# Patient Record
Sex: Male | Born: 1990 | Race: White | Hispanic: No | Marital: Single | Smoking: Never smoker
Health system: Southern US, Community
[De-identification: ages and names within clinical notes are randomized; demographics above are authoritative.]

## PROBLEM LIST (undated history)

## (undated) DIAGNOSIS — F32A Depression, unspecified: Secondary | ICD-10-CM

## (undated) DIAGNOSIS — F329 Major depressive disorder, single episode, unspecified: Secondary | ICD-10-CM

## (undated) HISTORY — PX: ABDOMINAL SURGERY: SHX537

## (undated) HISTORY — PX: NISSEN FUNDOPLICATION: SHX2091

---

## 2010-10-04 ENCOUNTER — Emergency Department (HOSPITAL_COMMUNITY): Admission: EM | Admit: 2010-10-04 | Discharge: 2010-10-04 | Payer: Self-pay | Admitting: Emergency Medicine

## 2012-12-24 ENCOUNTER — Ambulatory Visit (INDEPENDENT_AMBULATORY_CARE_PROVIDER_SITE_OTHER): Payer: BC Managed Care – PPO | Admitting: Family Medicine

## 2012-12-24 VITALS — BP 136/80 | HR 92 | Temp 97.5°F | Resp 16 | Ht 69.0 in | Wt 153.0 lb

## 2012-12-24 DIAGNOSIS — M79643 Pain in unspecified hand: Secondary | ICD-10-CM

## 2012-12-24 DIAGNOSIS — S61409A Unspecified open wound of unspecified hand, initial encounter: Secondary | ICD-10-CM

## 2012-12-24 DIAGNOSIS — M79609 Pain in unspecified limb: Secondary | ICD-10-CM

## 2012-12-24 NOTE — Progress Notes (Signed)
Verbal consent obtained from the patient.  Local anesthesia with 2cc Lidocaine 1% without epinephrine.  Wound scrubbed with soap and water and rinsed.  Wound closed with #3 5-0 Prolene (#1 HM, #2 SI) sutures.  Wound cleansed and dressed.

## 2012-12-24 NOTE — Progress Notes (Signed)
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 He had the day off and was trying to make some furniture. He says is soft some right when it was opened. He is confident that his last tetanus shot was within 5 years, that 4 years ago he got excessive shots before going to [place].  Objective: Left hand has a 1.5 cm wound in the palm of the hand. It is closed and not bleeding at this time. Fingers have good flexion extension and sensory and strength.  Assessment: Wound left hand  Plan: He will need sutures. It needs to be numbed up and explored to make sure no tendons were lacerated.

## 2012-12-24 NOTE — Patient Instructions (Addendum)

## 2012-12-31 ENCOUNTER — Ambulatory Visit (INDEPENDENT_AMBULATORY_CARE_PROVIDER_SITE_OTHER): Payer: BC Managed Care – PPO | Admitting: Physician Assistant

## 2012-12-31 VITALS — BP 132/84 | HR 68 | Temp 98.7°F | Resp 16 | Ht 69.5 in | Wt 153.0 lb

## 2012-12-31 DIAGNOSIS — Z4802 Encounter for removal of sutures: Secondary | ICD-10-CM

## 2012-12-31 NOTE — Progress Notes (Signed)
   Patient ID: Javione Gunawan MRN: [id number], DOB: [birth year] 22 y.o. Date of Encounter: 12/31/2012, 5:26 PM  Primary Physician: No primary provider on file.  Chief Complaint: Suture removal    See note from 12/24/12  HPI: 22 y.o. y/o male with injury to left hand Here for suture removal s/p placement on 12/24/12 Doing well No issues/complaints Afebrile/ No chills No erythema No pain Able to move without difficulty Normal sensation  No past medical history on file.   Home Meds: Prior to Admission medications   Medication Sig Start Date End Date Taking? Authorizing Provider  ibuprofen (ADVIL,MOTRIN) 200 MG tablet Take 200 mg by mouth every 6 (six) hours as needed.   Yes Historical Provider, MD    Allergies: No Known Allergies  Physical Exam: Blood pressure 132/84, pulse 68, temperature 98.7 F (37.1 C), temperature source Oral, resp. rate 16, height 5' 9.5" (1.765 m), weight 153 lb (69.4 kg), SpO2 98.00%., Body mass index is 22.27 kg/(m^2). General: Well developed, well nourished, in no acute distress. Head: Normocephalic, atraumatic, sclera non-icteric, no xanthomas, nares are without discharge.  Neck: Supple. Lungs: Breathing is unlabored. Heart: Normal rate. Msk:  Strength and tone appear normal for age. Wound: Wound well healed without erythema, swelling, or tenderness to palpation. FROM and 5/5 strength with normal sensation throughout including 2 point discrimination. Flexion and extension intact throughout all digits.  Skin: See above, otherwise dry without rash or erythema. Extremities: No clubbing or cyanosis. No edema. Neuro: Alert and oriented X 3. Moves all extremities spontaneously.  Psych:  Responds to questions appropriately with a normal affect.   PROCEDURE: Verbal consent obtained. 1 HM and 2 SI sutures removed without difficulty. Wound washed.  Assessment and Plan: 22 y.o. y/o male here for suture removal for wound described above. -Sutures removed per  above -Wound resolved -RTC prn -Patient with presyncopal episode after suture removal. He was placed in trendelenburg position and a cool rag was placed on his forehead. After a short while patient again felt like himself and he was able to sit up without further difficulty.    SignedEula Listen, PA-C 12/31/2012 5:26 PM

## 2013-01-01 ENCOUNTER — Ambulatory Visit (INDEPENDENT_AMBULATORY_CARE_PROVIDER_SITE_OTHER): Payer: BC Managed Care – PPO | Admitting: Physician Assistant

## 2013-01-01 VITALS — BP 135/78 | HR 71 | Temp 97.8°F | Resp 18 | Ht 69.0 in | Wt 154.0 lb

## 2013-01-01 DIAGNOSIS — S61409A Unspecified open wound of unspecified hand, initial encounter: Secondary | ICD-10-CM

## 2013-01-01 DIAGNOSIS — M79609 Pain in unspecified limb: Secondary | ICD-10-CM

## 2013-01-01 NOTE — Progress Notes (Signed)
  Subjective:    Patient ID: Chad Andrews, male    DOB: 12-Nov-1991, 22 y.o.   MRN: [id number]  HPI 22 year old male presents s/p suture removal yesterday. States he was closing the lid of a plastic bin today and placed a bit of pressure on the palm of his left hand and the wound re-opened.  Admits to pain and bleeding.  No numbness or tingling.  He is otherwise doing well with no other concerns today.      Review of Systems  Constitutional: Negative for fever and chills.  Musculoskeletal: Negative for joint swelling and arthralgias.  Skin: Positive for wound. Negative for color change.  All other systems reviewed and are negative.       Objective:   Physical Exam  Constitutional: He is oriented to person, place, and time. He appears well-developed and well-nourished.  HENT:  Head: Normocephalic and atraumatic.  Right Ear: External ear normal.  Left Ear: External ear normal.  Eyes: Conjunctivae normal are normal.  Neck: Normal range of motion.  Cardiovascular: Normal rate.   Pulmonary/Chest: Effort normal.  Neurological: He is alert and oriented to person, place, and time.  Skin:       Small 0.5 cm superficial opening to previously well healing laceration of left palm.  No surrounding erythema, drainage, or warmth.   Psychiatric: He has a normal mood and affect. His behavior is normal. Judgment and thought content normal.     Dermabond applied to wound. Patient tolerated well.      Assessment & Plan:   1. Wound, open, hand with or without fingers    Recommend at least 24-48 hours of limited use of left hand.   Wound doing well. Follow up as needed.

## 2016-04-05 ENCOUNTER — Emergency Department (HOSPITAL_COMMUNITY): Payer: BC Managed Care – PPO

## 2016-04-05 ENCOUNTER — Encounter (HOSPITAL_COMMUNITY): Payer: Self-pay | Admitting: Emergency Medicine

## 2016-04-05 ENCOUNTER — Emergency Department (HOSPITAL_COMMUNITY)
Admission: EM | Admit: 2016-04-05 | Discharge: 2016-04-05 | Disposition: A | Discharge status: Home / Self Care | Payer: BC Managed Care – PPO | Attending: Emergency Medicine | Admitting: Emergency Medicine

## 2016-04-05 DIAGNOSIS — R109 Unspecified abdominal pain: Secondary | ICD-10-CM

## 2016-04-05 DIAGNOSIS — Z79899 Other long term (current) drug therapy: Secondary | ICD-10-CM | POA: Insufficient documentation

## 2016-04-05 DIAGNOSIS — F329 Major depressive disorder, single episode, unspecified: Secondary | ICD-10-CM | POA: Insufficient documentation

## 2016-04-05 DIAGNOSIS — R112 Nausea with vomiting, unspecified: Secondary | ICD-10-CM | POA: Insufficient documentation

## 2016-04-05 DIAGNOSIS — R1010 Upper abdominal pain, unspecified: Secondary | ICD-10-CM | POA: Diagnosis not present

## 2016-04-05 DIAGNOSIS — R1013 Epigastric pain: Secondary | ICD-10-CM | POA: Diagnosis present

## 2016-04-05 HISTORY — DX: Depression, unspecified: F32.A

## 2016-04-05 HISTORY — DX: Major depressive disorder, single episode, unspecified: F32.9

## 2016-04-05 LAB — URINALYSIS, ROUTINE W REFLEX MICROSCOPIC
BILIRUBIN URINE: NEGATIVE
GLUCOSE, UA: NEGATIVE mg/dL
HGB URINE DIPSTICK: NEGATIVE
KETONES UR: NEGATIVE mg/dL
Leukocytes, UA: NEGATIVE
NITRITE: NEGATIVE
PH: 7 (ref 5.0–8.0)
Protein, ur: NEGATIVE mg/dL
SPECIFIC GRAVITY, URINE: 1.014 (ref 1.005–1.030)

## 2016-04-05 LAB — COMPREHENSIVE METABOLIC PANEL
ALBUMIN: 4.7 g/dL (ref 3.5–5.0)
ALT: 18 U/L (ref 17–63)
ANION GAP: 10 (ref 5–15)
AST: 23 U/L (ref 15–41)
Alkaline Phosphatase: 72 U/L (ref 38–126)
BILIRUBIN TOTAL: 1 mg/dL (ref 0.3–1.2)
BUN: 9 mg/dL (ref 6–20)
CHLORIDE: 103 mmol/L (ref 101–111)
CO2: 27 mmol/L (ref 22–32)
Calcium: 9.4 mg/dL (ref 8.9–10.3)
Creatinine, Ser: 0.87 mg/dL (ref 0.61–1.24)
GFR calc Af Amer: >60 mL/min (ref >60)
GFR calc non Af Amer: >60 mL/min (ref >60)
GLUCOSE: 97 mg/dL (ref 65–99)
POTASSIUM: 3.7 mmol/L (ref 3.5–5.1)
SODIUM: 140 mmol/L (ref 135–145)
Total Protein: 8.2 g/dL — ABNORMAL HIGH (ref 6.5–8.1)

## 2016-04-05 LAB — CBC
HEMATOCRIT: 48.2 % (ref 39.0–52.0)
HEMOGLOBIN: 16.5 g/dL (ref 13.0–17.0)
MCH: 30.3 pg (ref 26.0–34.0)
MCHC: 34.2 g/dL (ref 30.0–36.0)
MCV: 88.6 fL (ref 78.0–100.0)
Platelets: 256 10*3/uL (ref 150–400)
RBC: 5.44 MIL/uL (ref 4.22–5.81)
RDW: 12.5 % (ref 11.5–15.5)
WBC: 8 10*3/uL (ref 4.0–10.5)

## 2016-04-05 LAB — LIPASE, BLOOD: LIPASE: 83 U/L — AB (ref 11–51)

## 2016-04-05 MED ORDER — IOPAMIDOL (ISOVUE-300) INJECTION 61%
100.0000 mL | Freq: Once | INTRAVENOUS | Status: AC | PRN
  Administered 2016-04-05: 100 mL via INTRAVENOUS

## 2016-04-05 MED ORDER — DIATRIZOATE MEGLUMINE & SODIUM 66-10 % PO SOLN
30.0000 mL | Freq: Once | ORAL | Status: AC
  Administered 2016-04-05: 30 mL via ORAL

## 2016-04-05 MED ORDER — ONDANSETRON HCL 4 MG/2ML IJ SOLN
4.0000 mg | Freq: Once | INTRAMUSCULAR | Status: AC
  Administered 2016-04-05: 4 mg via INTRAVENOUS
  Filled 2016-04-05: qty 2

## 2016-04-05 MED ORDER — SODIUM CHLORIDE 0.9 % IV BOLUS (SEPSIS)
1000.0000 mL | Freq: Once | INTRAVENOUS | Status: AC
  Administered 2016-04-05: 1000 mL via INTRAVENOUS

## 2016-04-05 MED ORDER — ONDANSETRON 4 MG PO TBDP: 4.0000 mg | ORAL_TABLET | Freq: Three times a day (TID) | ORAL | Status: AC | PRN

## 2016-04-05 NOTE — ED Notes (Signed)
Pt has a hx of Nissen Fundoplication surgery as a child. States the first time he's ever vomited in his life was yesterday after a work out. Since vomiting has had sharp worsening upper abdominal pain. Says since he's occasionally felt light headed, seeing spots, and continued to feel nauseous. Does not appear to be in obvious distress in triage. Abdomin firm on palpation, tender towards the epigastrium.

## 2016-04-05 NOTE — ED Notes (Signed)
RN starting IV will collect labs 

## 2016-04-05 NOTE — Discharge Instructions (Signed)
Abdominal Pain, Adult °Many things can cause abdominal pain. Usually, abdominal pain is not caused by a disease and will improve without treatment. It can often be observed and treated at home. Your health care provider will do a physical exam and possibly order blood tests and X-rays to help determine the seriousness of your pain. However, in many cases, more time must pass before a clear cause of the pain can be found. Before that point, your health care provider may not know if you need more testing or further treatment. °HOME CARE INSTRUCTIONS °Monitor your abdominal pain for any changes. The following actions may help to alleviate any discomfort you are experiencing: °· Only take over-the-counter or prescription medicines as directed by your health care provider. °· Do not take laxatives unless directed to do so by your health care provider. °· Try a clear liquid diet (broth, tea, or water) as directed by your health care provider. Slowly move to a bland diet as tolerated. °SEEK MEDICAL CARE IF: °· You have unexplained abdominal pain. °· You have abdominal pain associated with nausea or diarrhea. °· You have pain when you urinate or have a bowel movement. °· You experience abdominal pain that wakes you in the night. °· You have abdominal pain that is worsened or improved by eating food. °· You have abdominal pain that is worsened with eating fatty foods. °· You have a fever. °SEEK IMMEDIATE MEDICAL CARE IF: °· Your pain does not go away within 2 hours. °· You keep throwing up (vomiting). °· Your pain is felt only in portions of the abdomen, such as the right side or the left lower portion of the abdomen. °· You pass bloody or black tarry stools. °MAKE SURE YOU: °· Understand these instructions. °· Will watch your condition. °· Will get help right away if you are not doing well or get worse. °  °This information is not intended to replace advice given to you by your health care provider. Make sure you discuss  any questions you have with your health care provider. °  °Document Released: 08/31/2005 Document Revised: 08/12/2015 Document Reviewed: 07/31/2013 °Elsevier Interactive Patient Education ©2016 Elsevier Inc. ° °Nausea and Vomiting °Nausea is a sick feeling that often comes before throwing up (vomiting). Vomiting is a reflex where stomach contents come out of your mouth. Vomiting can cause severe loss of body fluids (dehydration). Children and elderly adults can become dehydrated quickly, especially if they also have diarrhea. Nausea and vomiting are symptoms of a condition or disease. It is important to find the cause of your symptoms. °CAUSES  °· Direct irritation of the stomach lining. This irritation can result from increased acid production (gastroesophageal reflux disease), infection, food poisoning, taking certain medicines (such as nonsteroidal anti-inflammatory drugs), alcohol use, or tobacco use. °· Signals from the brain. These signals could be caused by a headache, heat exposure, an inner ear disturbance, increased pressure in the brain from injury, infection, a tumor, or a concussion, pain, emotional stimulus, or metabolic problems. °· An obstruction in the gastrointestinal tract (bowel obstruction). °· Illnesses such as diabetes, hepatitis, gallbladder problems, appendicitis, kidney problems, cancer, sepsis, atypical symptoms of a heart attack, or eating disorders. °· Medical treatments such as chemotherapy and radiation. °· Receiving medicine that makes you sleep (general anesthetic) during surgery. °DIAGNOSIS °Your caregiver may ask for tests to be done if the problems do not improve after a few days. Tests may also be done if symptoms are severe or if the reason for the   nausea and vomiting is not clear. Tests may include: °· Urine tests. °· Blood tests. °· Stool tests. °· Cultures (to look for evidence of infection). °· X-rays or other imaging studies. °Test results can help your caregiver make  decisions about treatment or the need for additional tests. °TREATMENT °You need to stay well hydrated. Drink frequently but in small amounts. You may wish to drink water, sports drinks, clear broth, or eat frozen ice pops or gelatin dessert to help stay hydrated. When you eat, eating slowly may help prevent nausea. There are also some antinausea medicines that may help prevent nausea. °HOME CARE INSTRUCTIONS  °· Take all medicine as directed by your caregiver. °· If you do not have an appetite, do not force yourself to eat. However, you must continue to drink fluids. °· If you have an appetite, eat a normal diet unless your caregiver tells you differently. °¨ Eat a variety of complex carbohydrates (rice, wheat, potatoes, bread), lean meats, yogurt, fruits, and vegetables. °¨ Avoid high-fat foods because they are more difficult to digest. °· Drink enough water and fluids to keep your urine clear or pale yellow. °· If you are dehydrated, ask your caregiver for specific rehydration instructions. Signs of dehydration may include: °¨ Severe thirst. °¨ Dry lips and mouth. °¨ Dizziness. °¨ Dark urine. °¨ Decreasing urine frequency and amount. °¨ Confusion. °¨ Rapid breathing or pulse. °SEEK IMMEDIATE MEDICAL CARE IF:  °· You have blood or brown flecks (like coffee grounds) in your vomit. °· You have black or bloody stools. °· You have a severe headache or stiff neck. °· You are confused. °· You have severe abdominal pain. °· You have chest pain or trouble breathing. °· You do not urinate at least once every 8 hours. °· You develop cold or clammy skin. °· You continue to vomit for longer than 24 to 48 hours. °· You have a fever. °MAKE SURE YOU:  °· Understand these instructions. °· Will watch your condition. °· Will get help right away if you are not doing well or get worse. °  °This information is not intended to replace advice given to you by your health care provider. Make sure you discuss any questions you have with  your health care provider. °  °Document Released: 11/21/2005 Document Revised: 02/13/2012 Document Reviewed: 04/20/2011 °Elsevier Interactive Patient Education ©2016 Elsevier Inc. ° °

## 2016-04-05 NOTE — ED Notes (Signed)
PT DISCHARGED. INSTRUCTIONS AND PRESCRIPTION GIVEN. AAOX3. PT IN NO APPARENT DISTRESS OR PAIN. THE OPPORTUNITY TO ASK QUESTIONS WAS PROVIDED. 

## 2016-04-05 NOTE — ED Provider Notes (Signed)
CSN: [id number]     Arrival date & time 04/05/16  1011 History   First MD Initiated Contact with Patient 04/05/16 1116     No chief complaint on file.  HPI  Mr. Chad Andrews is a 25 year old male with past medical history of depression and severe reflux as an infant requiring Nissen fundoplication presenting with nausea, vomiting and abdominal pain. Patient states that he was working out yesterday and immediately vomited afterwards. He states this is the first time he has ever vomited in his entire life. He reports onset of sharp upper abdominal pain since the emesis. He states the pain is mostly in the epigastric region but can also be felt in the right and left upper quadrants. The pain is intermittent without specific exacerbating factors. He endorses nausea but no other episodes of vomiting. He also endorses occasional lightheadedness and "seeing spots". He states that this typically happens when he is up and moving around. Denies fevers, chills, urinary symptoms, chest pain, shortness of breath, constipation or diarrhea. He denies following with a gastroenterologist.  Past Medical History  Diagnosis Date  . Depression    Past Surgical History  Procedure Laterality Date  . Nissen fundoplication    . Abdominal surgery     History reviewed. No pertinent family history. Social History  Substance Use Topics  . Smoking status: Never Smoker   . Smokeless tobacco: None  . Alcohol Use: None    Review of Systems  All other systems reviewed and are negative.     Allergies  Review of patient's allergies indicates no known allergies.  Home Medications   Prior to Admission medications   Medication Sig Start Date End Date Taking? Authorizing Provider  escitalopram (LEXAPRO) 10 MG tablet Take 10 mg by mouth daily. 01/20/16  Yes Historical Provider, MD  ibuprofen (ADVIL,MOTRIN) 200 MG tablet Take 600 mg by mouth every 6 (six) hours as needed for moderate pain.    Yes Historical Provider, MD   naproxen sodium (ANAPROX) 220 MG tablet Take 220 mg by mouth 2 (two) times daily as needed (pain).   Yes Historical Provider, MD  ondansetron (ZOFRAN ODT) 4 MG disintegrating tablet Take 1 tablet (4 mg total) by mouth every 8 (eight) hours as needed for nausea or vomiting. 04/05/16   Mandi Mattioli, PA-C   BP 125/75 mmHg  Pulse 74  Temp(Src) 98 F (36.7 C) (Oral)  Resp 18  Ht  (1.753 m)  Wt 77.111 kg  BMI 25.09 kg/m2  SpO2 100% Physical Exam  Constitutional: He appears well-developed and well-nourished. No distress.  Nontoxic-appearing  HENT:  Head: Normocephalic and atraumatic.  Eyes: Conjunctivae are normal. Right eye exhibits no discharge. Left eye exhibits no discharge. No scleral icterus.  Neck: Normal range of motion.  Cardiovascular: Normal rate, regular rhythm and normal heart sounds.   Pulmonary/Chest: Effort normal and breath sounds normal. No respiratory distress.  Abdominal: Soft. Bowel sounds are normal. He exhibits no distension. There is tenderness (mild upper abdominal tenderness). There is no rebound and no guarding.  Musculoskeletal: Normal range of motion.  Neurological: He is alert. Coordination normal.  Skin: Skin is warm and dry.  Psychiatric: He has a normal mood and affect. His behavior is normal.  Nursing note and vitals reviewed.   ED Course  Procedures (including critical care time) Labs Review Labs Reviewed  LIPASE, BLOOD - Abnormal; Notable for the following:    Lipase 83 (*)    All other components within normal limits  COMPREHENSIVE  METABOLIC PANEL - Abnormal; Notable for the following:    Total Protein 8.2 (*)    All other components within normal limits  URINALYSIS, ROUTINE W REFLEX MICROSCOPIC (NOT AT [facility]) - Abnormal; Notable for the following:    APPearance CLOUDY (*)    All other components within normal limits  CBC    Imaging Review Ct Abdomen Pelvis W Contrast  04/05/2016  CLINICAL DATA:  Upper abdomen pain since this morning.  Patient has a history of Nissen fundoplication. EXAM: CT ABDOMEN AND PELVIS WITH CONTRAST TECHNIQUE: Multidetector CT imaging of the abdomen and pelvis was performed using the standard protocol following bolus administration of intravenous contrast. CONTRAST:  100mL ISOVUE-300 IOPAMIDOL (ISOVUE-300) INJECTION 61% COMPARISON:  None. FINDINGS: Lower chest: There is a 2 mm peripheral nodule in the lateral inferior left upper lobe. There is no focal pneumonia or pleural effusion. The heart size is normal. Hepatobiliary: The gallbladder and liver are normal. No masses or other significant abnormality. Pancreas: No mass, inflammatory changes, or other significant abnormality. Spleen: Within normal limits in size and appearance. Adrenals/Urinary Tract: The kidneys and adrenal glands are normal. The bladder is normal. No masses identified. No evidence of hydronephrosis. Stomach/Bowel: No evidence of obstruction, inflammatory process, or abnormal fluid collections. There is no diverticulitis. The appendix is normal. Vascular/Lymphatic: No pathologically enlarged lymph nodes. No evidence of abdominal aortic aneurysm. Reproductive: No mass or other significant abnormality. Other: None. Musculoskeletal:  No suspicious bone lesions identified. IMPRESSION: No acute abnormality identified in the abdomen and pelvis. 2 mm peripheral nodule in the lateral inferior left upper lobe. No follow-up needed if patient is low-risk. Non-contrast chest CT can be considered in 12 months if patient is high-risk. This recommendation follows the consensus statement: Guidelines for Management of Incidental Pulmonary Nodules Detected on CT Images:From the Fleischner Society 2017; published online before print (10.1148/[id number]). Electronically Signed   By: Sherian ReinWei-Chen  Lin M.D.   On: 04/05/2016 15:18   I have personally reviewed and evaluated these images and lab results as part of my medical decision-making.   EKG Interpretation None       MDM   Final diagnoses:  Abdominal pain  Non-intractable vomiting with nausea, vomiting of unspecified type   25 year old with history of nissen fundoplication presenting with nausea, vomiting and abdominal pain x 1 day. VSS. Patient is nontoxic, nonseptic appearing, in no apparent distress. Abdomen is soft with tenderness in upper quadrants. No peritoneal signs suggesting surgical abdomen. Patient's pain and other symptoms adequately managed in emergency department. Fluid bolus given. Labs, imaging and vitals reviewed. No leukocytosis. Slightly elevated lipase at 83. UA unremarkable. CT abdomen without acute abnormality. Does note a 2 mm nodule of the left upper lobe. Pt is not a smoker and is low risk so no follow up needed. Discussed this with patient at bedside. Pt reports resolution of symptoms after fluids and zofran. Pt requests discharge as he feels returned to baseline. Patient does not meet the SIRS or Sepsis criteria. Repeat abdominal exam before discharge with resolution of tenderness. Patient discharged home with symptomatic treatment and given strict instructions for follow-up with their primary care physician.  I have also discussed reasons to return immediately to the ER.  Patient expresses understanding and agrees with plan.      Alveta HeimlichStevi Dupree Givler, PA-C 04/05/16 1536  Lorre NickAnthony Allen, MD 04/08/16 [phone]

## 2016-04-12 ENCOUNTER — Encounter (HOSPITAL_COMMUNITY): Payer: Self-pay | Admitting: *Deleted

## 2016-04-12 ENCOUNTER — Emergency Department (HOSPITAL_COMMUNITY): Payer: BC Managed Care – PPO

## 2016-04-12 ENCOUNTER — Emergency Department (HOSPITAL_COMMUNITY)
Admission: EM | Admit: 2016-04-12 | Discharge: 2016-04-12 | Disposition: A | Discharge status: Home / Self Care | Payer: BC Managed Care – PPO | Attending: Emergency Medicine | Admitting: Emergency Medicine

## 2016-04-12 DIAGNOSIS — F329 Major depressive disorder, single episode, unspecified: Secondary | ICD-10-CM | POA: Insufficient documentation

## 2016-04-12 DIAGNOSIS — M542 Cervicalgia: Secondary | ICD-10-CM | POA: Diagnosis present

## 2016-04-12 DIAGNOSIS — M50921 Unspecified cervical disc disorder at C4-C5 level: Secondary | ICD-10-CM | POA: Diagnosis not present

## 2016-04-12 DIAGNOSIS — M5412 Radiculopathy, cervical region: Secondary | ICD-10-CM | POA: Insufficient documentation

## 2016-04-12 DIAGNOSIS — Z79899 Other long term (current) drug therapy: Secondary | ICD-10-CM | POA: Insufficient documentation

## 2016-04-12 MED ORDER — DEXAMETHASONE 4 MG PO TABS
10.0000 mg | ORAL_TABLET | Freq: Once | ORAL | Status: AC
  Administered 2016-04-12: 10 mg via ORAL
  Filled 2016-04-12: qty 2

## 2016-04-12 MED ORDER — IBUPROFEN 800 MG PO TABS
800.0000 mg | ORAL_TABLET | Freq: Once | ORAL | Status: AC
  Administered 2016-04-12: 800 mg via ORAL
  Filled 2016-04-12: qty 1

## 2016-04-12 MED ORDER — METHYLPREDNISOLONE 4 MG PO TBPK: ORAL_TABLET | ORAL | Status: AC

## 2016-04-12 MED ORDER — IBUPROFEN 600 MG PO TABS: 600.0000 mg | ORAL_TABLET | Freq: Four times a day (QID) | ORAL | Status: AC | PRN

## 2016-04-12 MED ORDER — LORAZEPAM 1 MG PO TABS
1.0000 mg | ORAL_TABLET | Freq: Once | ORAL | Status: AC
  Administered 2016-04-12: 1 mg via ORAL
  Filled 2016-04-12: qty 1

## 2016-04-12 MED ORDER — OXYCODONE-ACETAMINOPHEN 5-325 MG PO TABS
1.0000 | ORAL_TABLET | ORAL | Status: DC | PRN
  Administered 2016-04-12: 1 via ORAL
  Filled 2016-04-12: qty 1

## 2016-04-12 MED ORDER — OXYCODONE-ACETAMINOPHEN 5-325 MG PO TABS: 1.0000 | ORAL_TABLET | Freq: Four times a day (QID) | ORAL | Status: AC | PRN

## 2016-04-12 NOTE — Discharge Instructions (Signed)
You were seen and evaluated today for your neck pain and right arm weakness. 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 Your MRI was discussed with the neurosurgeon on call. Please follow-up with him in about a week for reevaluation. Take the medications prescribed. Take an antacid to help protect her stomach as steroids and anti-inflammatory medications can be hard on your stomach and increased stomach acid.  Refrain from any heavy lifting, strenuous exercise until cleared by the neurosurgeon.  Cervical Radiculopathy Cervical radiculopathy happens when a nerve in the neck (cervical nerve) is pinched or bruised. This condition can develop because of an injury or as part of the normal aging process. Pressure on the cervical nerves can cause pain or numbness that runs from the neck all the way down into the arm and fingers. Usually, this condition gets better with rest. Treatment may be needed if the condition does not improve.  CAUSES This condition may be caused by:  Injury.  Slipped (herniated) disk.  Muscle tightness in the neck because of overuse.  Arthritis.  Breakdown or degeneration in the bones and joints of the spine (spondylosis) due to aging.  Bone spurs that may develop near the cervical nerves. SYMPTOMS Symptoms of this condition include:  Pain that runs from the neck to the arm and hand. The pain can be severe or irritating. It may be worse when the neck is moved.  Numbness or weakness in the affected arm and hand. DIAGNOSIS This condition may be diagnosed based on symptoms, medical history, and a physical exam. You may also have tests, including:  X-rays.  CT scan.  MRI.  Electromyogram (EMG).  Nerve conduction tests. TREATMENT In many cases, treatment is not needed for this condition. With rest, the condition usually gets better over time. If treatment is needed,  options may include:  Wearing a soft neck collar for short periods of time.  Physical therapy to strengthen your neck muscles.  Medicines, such as NSAIDs, oral corticosteroids, or spinal injections.  Surgery. This may be needed if other treatments do not help. Various types of surgery may be done depending on the cause of your problems. HOME CARE INSTRUCTIONS Managing Pain  Take over-the-counter and prescription medicines only as told by your health care provider.  If directed, apply ice to the affected area.  Put ice in a plastic bag.  Place a towel between your skin and the bag.  Leave the ice on for 20 minutes, 2-3 times per day.  If ice does not help, you can try using heat. Take a warm shower or warm bath, or use a heat pack as told by your health care provider.  Try a gentle neck and shoulder massage to help relieve symptoms. Activity  Rest as needed. Follow instructions from your health care provider about any restrictions on activities.  Do stretching and strengthening exercises as told by your health care provider or physical therapist. General Instructions  If you were given a soft collar, wear it as told by your health care provider.  Use a flat pillow when you sleep.  Keep all follow-up visits as told by your health care provider. This is important. SEEK MEDICAL CARE IF:  Your condition does not improve with treatment. SEEK IMMEDIATE MEDICAL CARE IF:  Your pain gets much worse and cannot be controlled with medicines.  You have weakness or numbness in your hand,  arm, face, or leg.  You have a high fever.  You have a stiff, rigid neck.  You lose control of your bowels or your bladder (have incontinence).  You have trouble with walking, balance, or speaking.   This information is not intended to replace advice given to you by your health care provider. Make sure you discuss any questions you have with your health care provider.   Document Released:  08/16/2001 Document Revised: 08/12/2015 Document Reviewed: 01/15/2015 Elsevier Interactive Patient Education [employer]2016 Elsevier Inc.

## 2016-04-12 NOTE — ED Notes (Signed)
Patient transported to MRI 

## 2016-04-12 NOTE — ED Notes (Signed)
Pt reports he was at the gym this am, was getting a bar over his head to do presses when he heard a "pop" in his neck.  Pt reports pain is radiating down to his R arm.   Intermittent numbness and tingling of R hand.

## 2016-04-12 NOTE — ED Notes (Signed)
Pt ready for transport to MRI.

## 2016-04-12 NOTE — ED Provider Notes (Signed)
CSN: [id number]     Arrival date & time 04/12/16  0705 History   First MD Initiated Contact with Patient 04/12/16 [id number]     Chief Complaint  Patient presents with  . Neck Pain  . Arm Pain     (Consider location/radiation/quality/duration/timing/severity/associated sxs/prior Treatment) HPI Comments: 25 year old male with no significant past medical history presents for neck pain and right arm pain. The patient reports that he was lifting this morning and was doing presses over his head when he suddenly heard a pop and felt sudden pain in his neck that seemed to radiate down his right arm. Since that time he has had weakness in his right arm. Denies any fevers or chills. No previous similar injuries.   Past Medical History  Diagnosis Date  . Depression    Past Surgical History  Procedure Laterality Date  . Nissen fundoplication    . Abdominal surgery     No family history on file. Social History  Substance Use Topics  . Smoking status: Never Smoker   . Smokeless tobacco: None  . Alcohol Use: Yes     Comment: occa    Review of Systems  Constitutional: Negative for fever, chills and fatigue.  HENT: Negative for congestion and rhinorrhea.   Respiratory: Negative for cough, chest tightness and shortness of breath.   Cardiovascular: Negative for chest pain, palpitations and leg swelling.  Gastrointestinal: Negative for nausea, vomiting, abdominal pain and diarrhea.  Genitourinary: Negative for dysuria, urgency and hematuria.  Musculoskeletal: Positive for neck pain. Negative for myalgias and back pain.  Skin: Negative for rash.  Neurological: Positive for weakness (right arm). Negative for dizziness, tremors, numbness and headaches.      Allergies  Review of patient's allergies indicates no known allergies.  Home Medications   Prior to Admission medications   Medication Sig Start Date End Date Taking? Authorizing Provider  escitalopram (LEXAPRO) 10 MG tablet Take 10 mg by  mouth daily. 01/20/16  Yes Historical Provider, MD  ibuprofen (ADVIL,MOTRIN) 200 MG tablet Take 600 mg by mouth every 6 (six) hours as needed for moderate pain.    Yes Historical Provider, MD  naproxen sodium (ANAPROX) 220 MG tablet Take 220 mg by mouth 2 (two) times daily as needed (pain).   Yes Historical Provider, MD  ondansetron (ZOFRAN ODT) 4 MG disintegrating tablet Take 1 tablet (4 mg total) by mouth every 8 (eight) hours as needed for nausea or vomiting. 04/05/16  Yes Stevi Barrett, PA-C   BP 139/96 mmHg  Pulse 76  Temp(Src) 97.8 F (36.6 C) (Oral)  Resp 17  Ht 5\' 9"  (1.753 m)  Wt 170 lb (77.111 kg)  BMI 25.09 kg/m2  SpO2 98% Physical Exam  Constitutional: He is oriented to person, place, and time. He appears well-developed and well-nourished. No distress.  HENT:  Head: Normocephalic and atraumatic.  Right Ear: External ear normal.  Left Ear: External ear normal.  Mouth/Throat: Oropharynx is clear and moist. No oropharyngeal exudate.  Eyes: EOM are normal. Pupils are equal, round, and reactive to light.  Neck: Neck supple. Muscular tenderness (rightsided) present. No spinous process tenderness present.  Decreased range of motion secondary to pain  Cardiovascular: Normal rate, regular rhythm, normal heart sounds and intact distal pulses.   No murmur heard. Pulmonary/Chest: Effort normal. No respiratory distress. He has no wheezes. He has no rales.  Abdominal: Soft. He exhibits no distension. There is no tenderness.  Musculoskeletal: He exhibits no edema.  Neurological: He is alert and oriented  to person, place, and time. No cranial nerve deficit or sensory deficit. Gait normal.  Right arm weakness on exam when compared. To left. Normal sensation over the right arm. No sensory deficits on examination.  Skin: Skin is warm and dry. No rash noted. He is not diaphoretic.  Vitals reviewed.   ED Course  Procedures (including critical care time) Labs Review Labs Reviewed - No data  to display  Imaging Review Dg Cervical Spine 2-3 Views  04/12/2016  CLINICAL DATA:  Pain after reaching; radicular symptoms into right upper extremity EXAM: CERVICAL SPINE - 2-3 VIEW COMPARISON:  None. FINDINGS: Frontal, lateral, and open-mouth odontoid images were obtained. There is no fracture or spondylolisthesis. Prevertebral soft tissues and predental space regions are normal. The disc spaces appear normal. No erosive change. IMPRESSION: No fracture or spondylolisthesis.  No appreciable arthropathy. Electronically Signed   By: Bretta Bang III M.D.   On: 04/12/2016 08:12   Mr Cervical Spine Wo Contrast  04/12/2016  CLINICAL DATA:  Right hand trauma, felt a pop in the neck. Right arm pain. EXAM: MRI CERVICAL SPINE WITHOUT CONTRAST TECHNIQUE: Multiplanar, multisequence MR imaging of the cervical spine was performed. No intravenous contrast was administered. COMPARISON:  None. FINDINGS: Alignment:  Normal cervical lordosis. No static listhesis. Bones: Vertebral body heights are maintained. Bone marrow signal is normal. Spinal cord:  Cervical cord is normal in size and signal. Posterior fossa: No focal abnormality. Vessels:  Normal flow voids are present. Paraspinal tissues:  No focal abnormality. Disc levels: Discs: Disc heights are relatively well maintained. C2-3: No significant disc bulge. No neural foraminal stenosis. No central canal stenosis. C3-4: No significant disc bulge. No neural foraminal stenosis. No central canal stenosis. C4-5: Moderate sized right paracentral disc protrusion flattening the right paracentral ventral cervical spinal cord. No neural foraminal stenosis. No central canal stenosis. C5-6: Mild broad-based disc bulge. No neural foraminal stenosis. No central canal stenosis. C6-7: No significant disc bulge. No neural foraminal stenosis. No central canal stenosis. C7-T1: No significant disc bulge. No neural foraminal stenosis. No central canal stenosis. IMPRESSION: 1. At C4-5 there  is a moderate-sized right paracentral disc protrusion flattening the right paracentral ventral cervical spinal cord. Electronically Signed   By: Elige Ko   On: 04/12/2016 11:30   I have personally reviewed and evaluated these images and lab results as part of my medical decision-making.   EKG Interpretation None      MDM  Patient seen and evaluated in stable condition. Patient with right arm weakness as well as neck pain on examination. MRI showed moderate size right paracentral drip disc protrusion at C4-C5 with flattening of the right paracentral ventral cervical spinal cord. Results were discussed on the phone with Dr. Renae Fickle from neurosurgery who at this time recommended medical management with pain control anti-inflammatories, steroids. This was discussed with the patient. He was instructed to use an antacid. He and his significant other expressed understanding and agreement with plan for discharge and outpatient follow-up with neurosurgery in about one week. Contact information for neurosurgery was provided at the time of discharge. Final diagnoses:  None    1. Cervical radiculopathy 2. Disc protrusion    Leta Baptist, MD 04/12/16 1233

## 2017-01-13 IMAGING — CT CT ABD-PELV W/ CM
2 of 4 series · 16 of 46 positions shown, 18 images · IV contrast (iopamidol)
Comparison: None.

CLINICAL DATA: Upper abdomen pain since this morning. Patient has a
history of Nissen fundoplication.

EXAM:
CT ABDOMEN AND PELVIS WITH CONTRAST
TECHNIQUE: Multidetector CT imaging of the abdomen and pelvis was performed
using the standard protocol following bolus administration of
intravenous contrast.
CONTRAST:  100mL [id number] IOPAMIDOL ([id number]) INJECTION 61%

[Series 2: abd/pel with · axial · 0.76mm/px · z∈[-551,-116]mm · 13 of 99 slices shown, 15 images]
[im 6/99  soft-tissue]
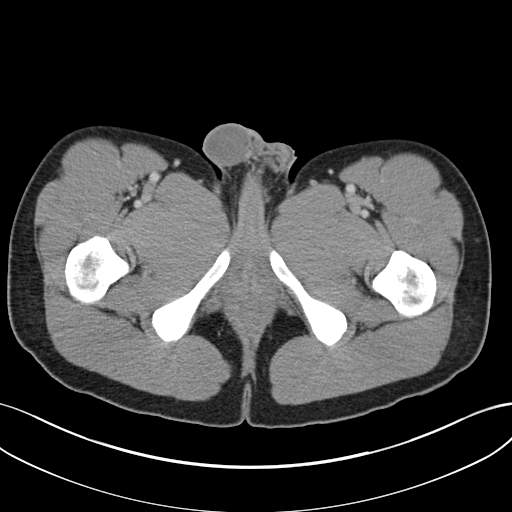
[im 6/99  bone]
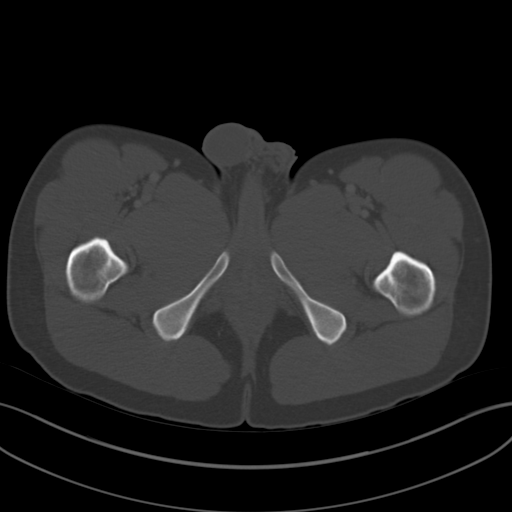
[im 16/99  soft-tissue]
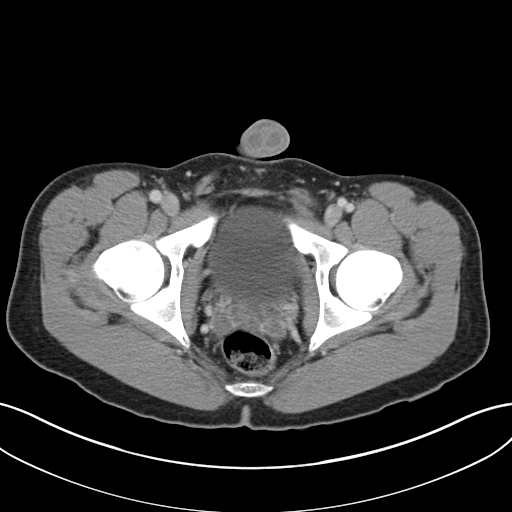
[im 21/99  soft-tissue]
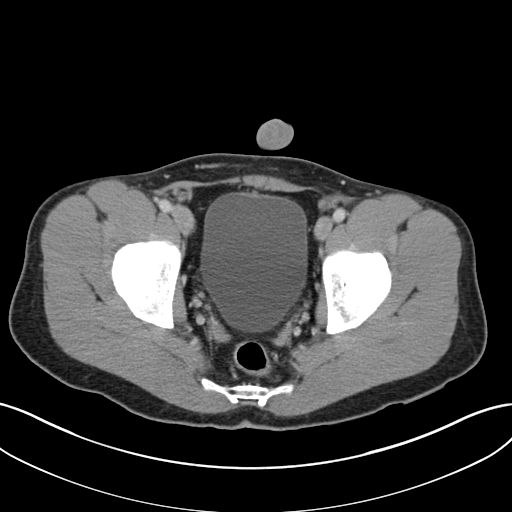
[im 26/99  soft-tissue]
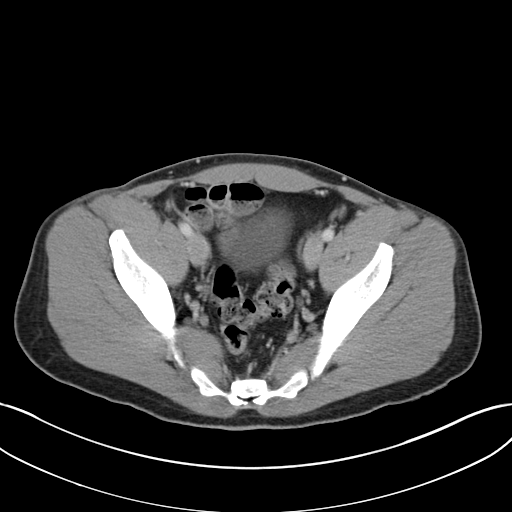
[im 37/99  soft-tissue]
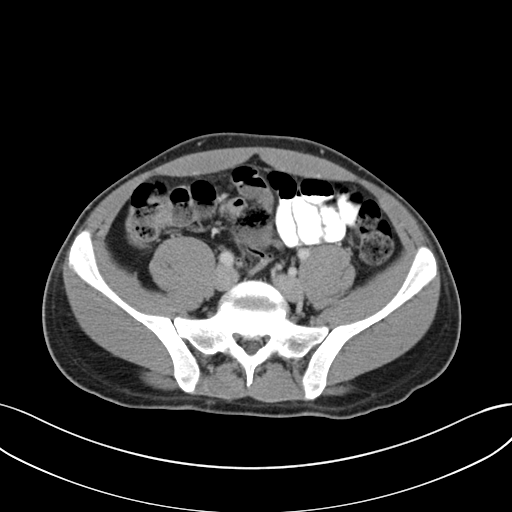
[im 42/99  soft-tissue]
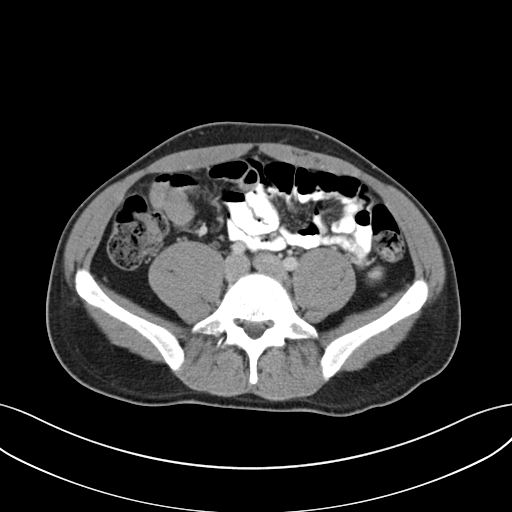
[im 52/99  soft-tissue]
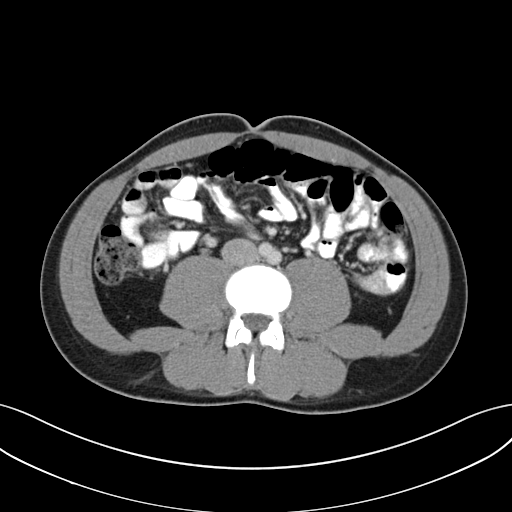
[im 57/99  soft-tissue]
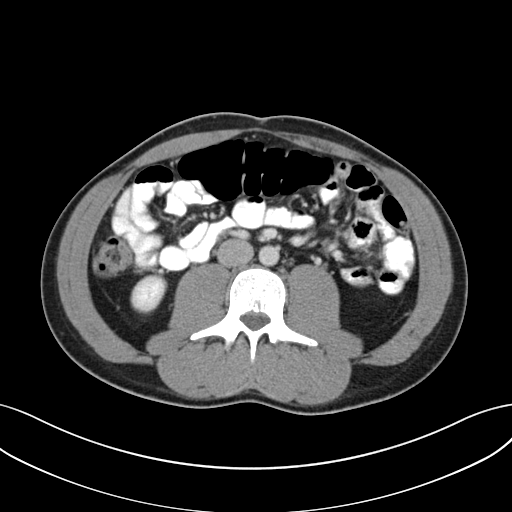
[im 62/99  soft-tissue]
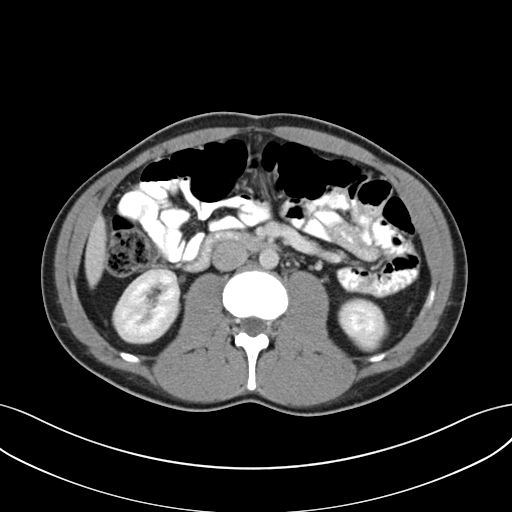
[im 62/99  bone]
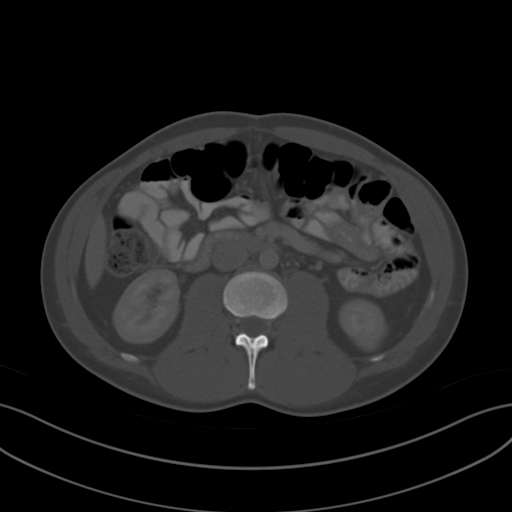
[im 73/99  soft-tissue]
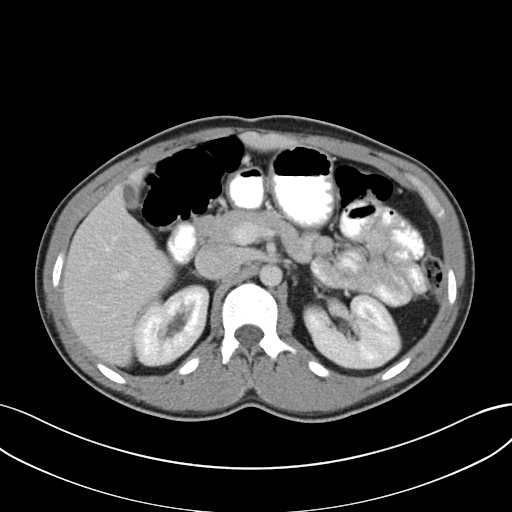
[im 78/99  soft-tissue]
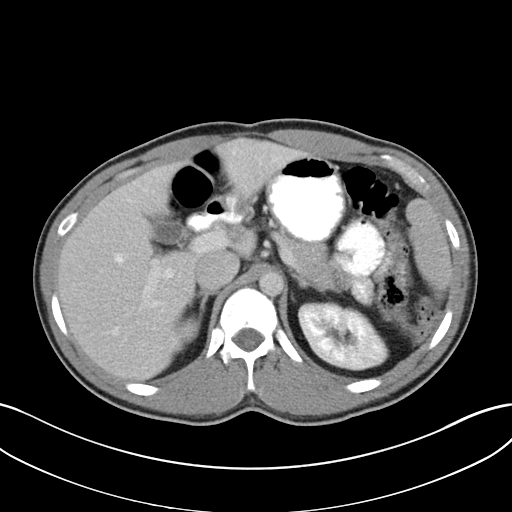
[im 83/99  soft-tissue]
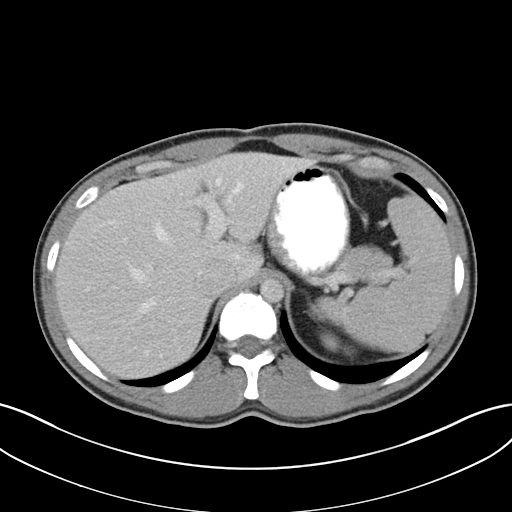
[im 93/99  soft-tissue]
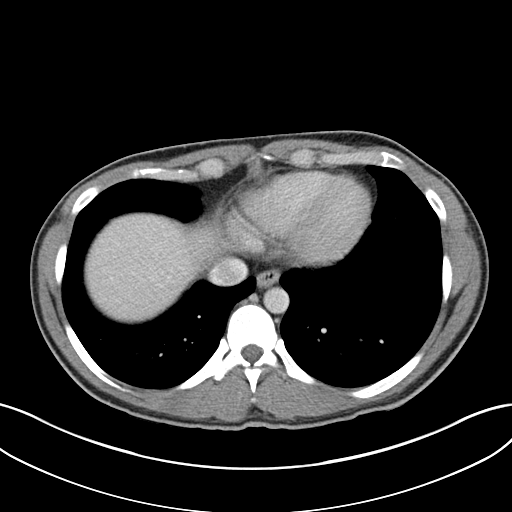

[Series 3: coronal a/|p · coronal · 0.70mm/px · 3 of 127 slices shown]
[im 43/127  soft-tissue]
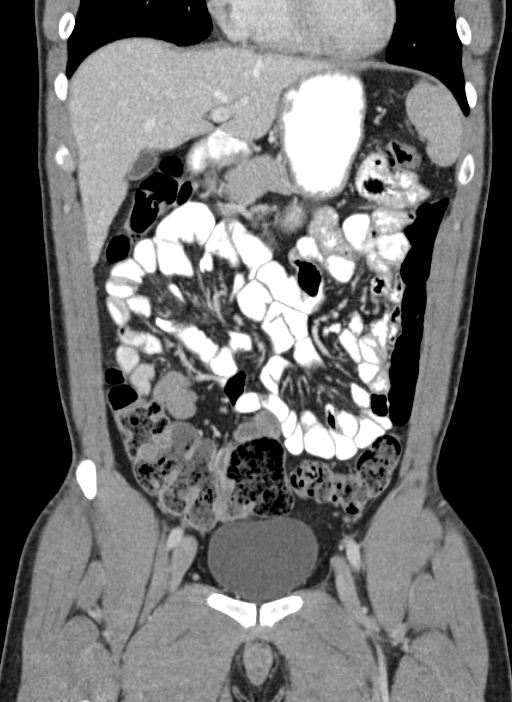
[im 57/127  soft-tissue]
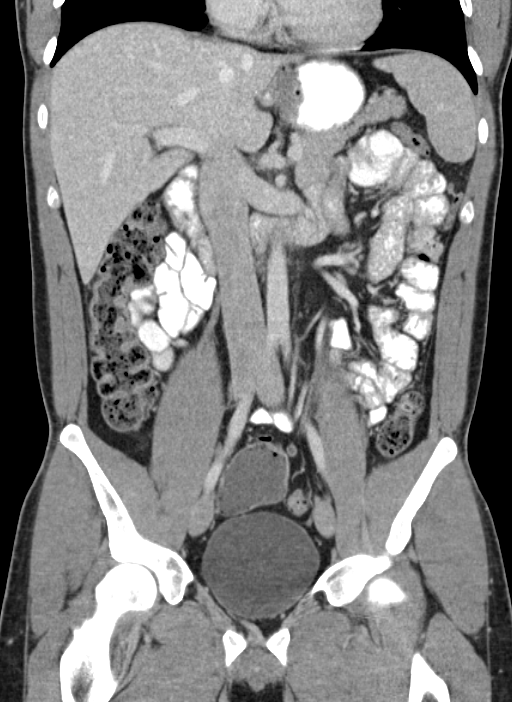
[im 71/127  soft-tissue]
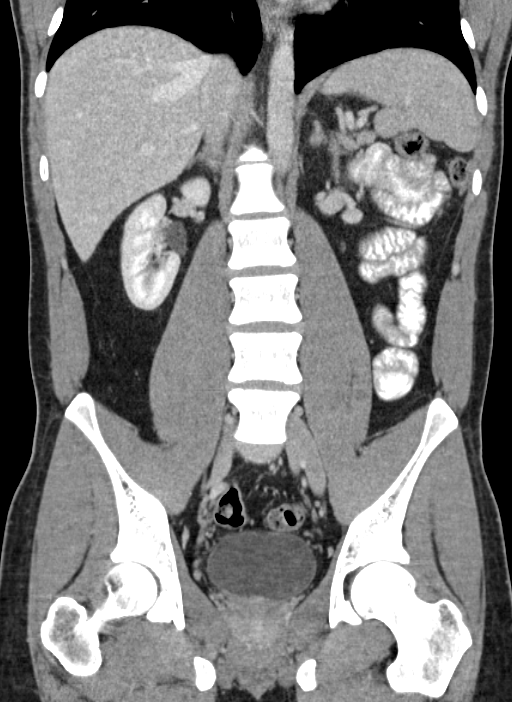

[16 of 46 positions shown; findings below may reference images not displayed]

FINDINGS: Lower chest: There is a 2 mm peripheral nodule in the lateral
inferior left upper lobe. There is no focal pneumonia or pleural
effusion. The heart size is normal.

Hepatobiliary: The gallbladder and liver are normal. No masses or
other significant abnormality.

Pancreas: No mass, inflammatory changes, or other significant
abnormality.

Spleen: Within normal limits in size and appearance.

Adrenals/Urinary Tract: The kidneys and adrenal glands are normal.
The bladder is normal. No masses identified. No evidence of
hydronephrosis.

Stomach/Bowel: No evidence of obstruction, inflammatory process, or
abnormal fluid collections. There is no diverticulitis. The appendix
is normal.

Vascular/Lymphatic: No pathologically enlarged lymph nodes. No
evidence of abdominal aortic aneurysm.

Reproductive: No mass or other significant abnormality.

Other: None.

Musculoskeletal:  No suspicious bone lesions identified.
IMPRESSION: No acute abnormality identified in the abdomen and pelvis.

2 mm peripheral nodule in the lateral inferior left upper lobe. No
follow-up needed if patient is low-risk. Non-contrast chest CT can
be considered in 12 months if patient is high-risk. This
recommendation follows the consensus statement: Guidelines for
Management of Incidental Pulmonary Nodules Detected on CT
Images:From the [HOSPITAL] 5149; published online before
print (10.1148/[id number]).
